# Patient Record
Sex: Female | Born: 1985 | Hispanic: Yes | Marital: Single | Smoking: Current every day smoker
Health system: Southern US, Community
[De-identification: ages and names within clinical notes are randomized; demographics above are authoritative.]

## PROBLEM LIST (undated history)

## (undated) DIAGNOSIS — F17210 Nicotine dependence, cigarettes, uncomplicated: Secondary | ICD-10-CM

## (undated) HISTORY — DX: Nicotine dependence, cigarettes, uncomplicated: F17.210

---

## 2020-09-17 ENCOUNTER — Other Ambulatory Visit: Payer: Self-pay | Admitting: Student in an Organized Health Care Education/Training Program

## 2020-09-17 DIAGNOSIS — R590 Localized enlarged lymph nodes: Secondary | ICD-10-CM

## 2020-09-17 DIAGNOSIS — R519 Headache, unspecified: Secondary | ICD-10-CM

## 2020-09-23 ENCOUNTER — Other Ambulatory Visit: Payer: Self-pay

## 2020-09-24 ENCOUNTER — Other Ambulatory Visit: Payer: Self-pay

## 2020-09-24 ENCOUNTER — Other Ambulatory Visit: Payer: Self-pay | Admitting: Student in an Organized Health Care Education/Training Program

## 2020-09-24 DIAGNOSIS — R519 Headache, unspecified: Secondary | ICD-10-CM

## 2020-09-24 DIAGNOSIS — R590 Localized enlarged lymph nodes: Secondary | ICD-10-CM

## 2020-09-28 ENCOUNTER — Other Ambulatory Visit: Payer: Medicaid Other

## 2020-09-28 ENCOUNTER — Other Ambulatory Visit: Payer: Self-pay | Admitting: Student in an Organized Health Care Education/Training Program

## 2020-09-28 DIAGNOSIS — R590 Localized enlarged lymph nodes: Secondary | ICD-10-CM

## 2020-09-30 ENCOUNTER — Other Ambulatory Visit: Payer: Self-pay | Admitting: Student in an Organized Health Care Education/Training Program

## 2020-09-30 DIAGNOSIS — R519 Headache, unspecified: Secondary | ICD-10-CM

## 2020-09-30 DIAGNOSIS — R591 Generalized enlarged lymph nodes: Secondary | ICD-10-CM

## 2020-10-01 ENCOUNTER — Other Ambulatory Visit: Payer: Medicaid Other

## 2020-10-02 ENCOUNTER — Other Ambulatory Visit: Payer: Medicaid Other

## 2020-10-08 ENCOUNTER — Ambulatory Visit
Admission: RE | Admit: 2020-10-08 | Discharge: 2020-10-08 | Disposition: A | Discharge status: Home / Self Care | Payer: Medicaid Other | Source: Ambulatory Visit | Attending: Student in an Organized Health Care Education/Training Program | Admitting: Student in an Organized Health Care Education/Training Program

## 2020-10-08 DIAGNOSIS — R591 Generalized enlarged lymph nodes: Secondary | ICD-10-CM

## 2020-10-11 ENCOUNTER — Telehealth: Payer: Self-pay | Admitting: Hematology

## 2020-10-11 NOTE — Telephone Encounter (Signed)
Received anew pt referral from [facility] for enlarged lymph nodes. Amber Montgomery has been cld and scheduled to see Dr. Irene Limbo on 8/10 at 1pm per pt's request. I offered her an appt later this week but pt declined.

## 2020-10-20 ENCOUNTER — Inpatient Hospital Stay: Payer: Medicaid Other | Admitting: Hematology

## 2020-10-22 ENCOUNTER — Other Ambulatory Visit: Payer: Self-pay | Admitting: Student in an Organized Health Care Education/Training Program

## 2020-10-22 ENCOUNTER — Other Ambulatory Visit: Payer: Self-pay

## 2020-10-22 ENCOUNTER — Encounter: Payer: Self-pay | Admitting: Hematology and Oncology

## 2020-10-22 ENCOUNTER — Inpatient Hospital Stay: Payer: Medicaid Other

## 2020-10-22 ENCOUNTER — Inpatient Hospital Stay: Payer: Medicaid Other | Attending: Hematology | Admitting: Hematology and Oncology

## 2020-10-22 VITALS — BP 108/69 | HR 63 | Temp 98.1°F | Resp 17 | Ht 62.0 in | Wt 211.3 lb

## 2020-10-22 DIAGNOSIS — R599 Enlarged lymph nodes, unspecified: Secondary | ICD-10-CM | POA: Diagnosis not present

## 2020-10-22 DIAGNOSIS — R591 Generalized enlarged lymph nodes: Secondary | ICD-10-CM | POA: Diagnosis not present

## 2020-10-22 DIAGNOSIS — C799 Secondary malignant neoplasm of unspecified site: Secondary | ICD-10-CM

## 2020-10-22 DIAGNOSIS — R519 Headache, unspecified: Secondary | ICD-10-CM | POA: Insufficient documentation

## 2020-10-22 DIAGNOSIS — R590 Localized enlarged lymph nodes: Secondary | ICD-10-CM

## 2020-10-22 LAB — CBC WITH DIFFERENTIAL (CANCER CENTER ONLY)
Abs Immature Granulocytes: 0.02 10*3/uL (ref 0.00–0.07)
Basophils Absolute: 0.1 10*3/uL (ref 0.0–0.1)
Basophils Relative: 1 %
Eosinophils Absolute: 0.3 10*3/uL (ref 0.0–0.5)
Eosinophils Relative: 5 %
HCT: 40.2 % (ref 36.0–46.0)
Hemoglobin: 13.8 g/dL (ref 12.0–15.0)
Immature Granulocytes: 0 %
Lymphocytes Relative: 29 %
Lymphs Abs: 2 10*3/uL (ref 0.7–4.0)
MCH: 32.3 pg (ref 26.0–34.0)
MCHC: 34.3 g/dL (ref 30.0–36.0)
MCV: 94.1 fL (ref 80.0–100.0)
Monocytes Absolute: 0.5 10*3/uL (ref 0.1–1.0)
Monocytes Relative: 7 %
Neutro Abs: 4 10*3/uL (ref 1.7–7.7)
Neutrophils Relative %: 58 %
Platelet Count: 337 10*3/uL (ref 150–400)
RBC: 4.27 MIL/uL (ref 3.87–5.11)
RDW: 12 % (ref 11.5–15.5)
WBC Count: 6.8 10*3/uL (ref 4.0–10.5)
nRBC: 0 % (ref 0.0–0.2)

## 2020-10-22 LAB — LACTATE DEHYDROGENASE: LDH: 170 U/L (ref 98–192)

## 2020-10-22 LAB — CMP (CANCER CENTER ONLY)
ALT: 16 U/L (ref 0–44)
AST: 11 U/L — ABNORMAL LOW (ref 15–41)
Albumin: 3.8 g/dL (ref 3.5–5.0)
Alkaline Phosphatase: 42 U/L (ref 38–126)
Anion gap: 7 (ref 5–15)
BUN: 12 mg/dL (ref 6–20)
CO2: 24 mmol/L (ref 22–32)
Calcium: 8.5 mg/dL — ABNORMAL LOW (ref 8.9–10.3)
Chloride: 109 mmol/L (ref 98–111)
Creatinine: 0.68 mg/dL (ref 0.44–1.00)
GFR, Estimated: >60 mL/min (ref >60)
Glucose, Bld: 96 mg/dL (ref 70–99)
Potassium: 4.5 mmol/L (ref 3.5–5.1)
Sodium: 140 mmol/L (ref 135–145)
Total Bilirubin: 0.3 mg/dL (ref 0.3–1.2)
Total Protein: 6.8 g/dL (ref 6.5–8.1)

## 2020-10-22 LAB — SEDIMENTATION RATE: Sed Rate: 10 mm/hr (ref 0–22)

## 2020-10-22 LAB — C-REACTIVE PROTEIN: CRP: <0.5 mg/dL (ref <1.0)

## 2020-10-22 LAB — SAVE SMEAR(SSMR), FOR PROVIDER SLIDE REVIEW

## 2020-10-22 NOTE — Progress Notes (Signed)
[facility] [phone]   [phone]  INITIAL CONSULT NOTE  Patient Care Team: Pcp, No as PCP - General  Hematological/Oncological History # Localized Enlarged Soft Tissue Foci 10/08/2020: US soft tissue neck showed multiple lobular soft tissue foci within the right supraclavicular/retroclavicular region of concern, measuring up to 4.8 x 1.4 x 6.0 cm. 10/22/2020: establish care with Dr. Lorenso Courier  CHIEF COMPLAINTS/PURPOSE OF CONSULTATION:  "Localized Enlarged Lymph Nodes "  HISTORY OF PRESENTING ILLNESS:  Amber Montgomery 35 y.o. female with medical history significant for low TSH, irregular menstrual cycle, and acute intractable headache who presents for evaluation of increased right supraclavicular lymphadenopathy.   On review of the previous records Amber Montgomery was found to have a soft tissue foci in the right supraclavicular retroclavicular region by her primary care provider.  It been present for several months.  An ultrasound of this lesion was ordered and on 10/08/2020 which showed a lesion measuring 4.8 x 1.4 x 6.0 cm.  Subsequent CT scan of the neck and chest were ordered.  Due to issues of severe headaches a CT scan and MRI of the brain were also ordered.  The patient was referred to oncology for further evaluation management.  On exam today Amber Montgomery reports that she is been having episodes of really terrible headaches.  She notes that they are "thunderclap headaches".  She notes that she has been vomiting and presented to her PCP with these concerns.  She recently moved from [place] to [place].  She notes that the lump in her neck has been growing over several months.  She notes when she first found it is about the size of a grape and now it is approximately the size of a small lemon.  She notes it is nontender and she does not have any other lymphadenopathy.  She has been having heavy sweats including sweats that caused her to change her  clothes or change the sheets at night.  She is not been having any fevers or chills and denies any diarrhea.  On further discussion she reports that she smokes 1/2 pack/day.  She does not drink any alcohol.  She currently works as an alcohol and Land.  Her family history is noncontributory.  A full 10 point ROS is listed below.  MEDICAL HISTORY:  Past Medical History:  Diagnosis Date   Smoking 1/2 pack a day or less     SURGICAL HISTORY: Past Surgical History:  Procedure Laterality Date   CESAREAN SECTION     Three c-sections    SOCIAL HISTORY: Social History   Socioeconomic History   Marital status: Single    Spouse name: Not on file   Number of children: 3   Years of education: Not on file   Highest education level: Not on file  Occupational History   Not on file  Tobacco Use   Smoking status: Every Day    Packs/day: 0.50    Years: 18.00    Pack years: 9.00    Types: Cigarettes   Smokeless tobacco: Not on file  Substance and Sexual Activity   Alcohol use: Not Currently   Drug use: Yes    Types: Marijuana   Sexual activity: Yes    Birth control/protection: Surgical  Other Topics Concern   Not on file  Social History Narrative   Not on file   Social Determinants of Health   Financial Resource Strain: Not on file  Food Insecurity: Not on file  Transportation  Needs: Not on file  Physical Activity: Not on file  Stress: Not on file  Social Connections: Not on file  Intimate Partner Violence: Not on file    FAMILY HISTORY: History reviewed. No pertinent family history.  ALLERGIES:  has No Known Allergies.  MEDICATIONS:  No current outpatient medications on file.   No current facility-administered medications for this visit.    REVIEW OF SYSTEMS:   Constitutional: ( - ) fevers, ( - )  chills , ( - ) night sweats Eyes: ( - ) blurriness of vision, ( - ) double vision, ( - ) watery eyes Ears, nose, mouth, throat, and face: ( - ) mucositis, ( - )  sore throat Respiratory: ( - ) cough, ( - ) dyspnea, ( - ) wheezes Cardiovascular: ( - ) palpitation, ( - ) chest discomfort, ( - ) lower extremity swelling Gastrointestinal:  ( - ) nausea, ( - ) heartburn, ( - ) change in bowel habits Skin: ( - ) abnormal skin rashes Lymphatics: ( - ) new lymphadenopathy, ( - ) easy bruising Neurological: ( - ) numbness, ( - ) tingling, ( - ) new weaknesses Behavioral/Psych: ( - ) mood change, ( - ) new changes  All other systems were reviewed with the patient and are negative.  PHYSICAL EXAMINATION: ECOG PERFORMANCE STATUS: 0 - Asymptomatic  Vitals:   10/22/20 0842  BP: 108/69  Pulse: 63  Resp: 17  Temp: 98.1 F (36.7 C)  SpO2: 98%   Filed Weights   10/22/20 0842  Weight: 211 lb 4.8 oz (95.8 kg)    GENERAL: well appearing young female in NAD  SKIN: skin color, texture, turgor are normal, no rashes or significant lesions EYES: conjunctiva are pink and non-injected, sclera clear NECK: supple, non-tender LYMPH: Soft tissue mass above the right clavicle.  Feels almost cystic in nature.  Not firm.  Otherwise no palpable lymphadenopathy in the cervical, axillary or supraclavicular lymph nodes.  LUNGS: clear to auscultation and percussion with normal breathing effort HEART: regular rate & rhythm and no murmurs and no lower extremity edema Musculoskeletal: no cyanosis of digits and no clubbing  PSYCH: alert & oriented x 3, fluent speech NEURO: no focal motor/sensory deficits  LABORATORY DATA:  I have reviewed the data as listed CBC Latest Ref Rng & Units 10/22/2020  WBC 4.0 - 10.5 K/uL 6.8  Hemoglobin 12.0 - 15.0 g/dL 13.8  Hematocrit 36.0 - 46.0 % 40.2  Platelets 150 - 400 K/uL 337    CMP Latest Ref Rng & Units 10/22/2020  Glucose 70 - 99 mg/dL 96  BUN 6 - 20 mg/dL 12  Creatinine 0.44 - 1.00 mg/dL 0.68  Sodium 135 - 145 mmol/L 140  Potassium 3.5 - 5.1 mmol/L 4.5  Chloride 98 - 111 mmol/L 109  CO2 22 - 32 mmol/L 24  Calcium 8.9 - 10.3  mg/dL 8.5(L)  Total Protein 6.5 - 8.1 g/dL 6.8  Total Bilirubin 0.3 - 1.2 mg/dL 0.3  Alkaline Phos 38 - 126 U/L 42  AST 15 - 41 U/L 11(L)  ALT 0 - 44 U/L 16     RADIOGRAPHIC STUDIES: US SOFT TISSUE HEAD & NECK (NON-THYROID)  Result Date: 10/08/2020 CLINICAL DATA:  Lymphadenopathy R59.1 (ICD-10-CM). Infraclavicular lymphadenopathy - right infraclavicular lymph node mass 2 inch by 1.5 inch nontender, movable, smooth borders, no skin color changes, enlarged over 1 month per pt but present for 1 yr smaller in size per pt cervical lymphadenopathy bilaterally on PE. Additional history provided  by scanning technologist: Patient reports right supraclavicular lymphadenopathy for months. EXAM: ULTRASOUND OF HEAD/NECK SOFT TISSUES TECHNIQUE: Ultrasound examination of the head and neck soft tissues was performed in the area of clinical concern. COMPARISON:  No pertinent prior exams available for comparison. FINDINGS: Within the right supraclavicular/retroclavicular region of concern, there are multiple lobular soft tissue foci which demonstrate homogeneous hyperechogenicity as compared to background fat. These foci measure up to 4.8 x 1.4 x 6.0 cm. These results will be called to the ordering clinician or representative by the Radiologist Assistant, and communication documented in the PACS or [employer]. IMPRESSION: Multiple lobular soft tissue foci within the right supraclavicular/retroclavicular region of concern, measuring up to 4.8 x 1.4 x 6.0 cm. Findings are most suggestive of conglomerate lymphadenopathy, and this is highly suspicious for nodal metastatic disease or a lymphoproliferative process (such as lymphoma). Multiple small masses may have a similar imaging appearance, and are not excluded. Further evaluation with contrast-enhanced CT of the neck and chest recommended for further evaluation. Electronically Signed   By: Jackey Loge DO   On: 10/08/2020 12:44    ASSESSMENT & PLAN Amber Montgomery  35 y.o. female with medical history significant for low TSH, irregular menstrual cycle, and acute intractable headache who presents for evaluation of increased right supraclavicular lymphadenopathy.   After review of the labs, review of the records, and discussion with the patient the patients findings are most consistent with a soft tissue mass of unclear etiology.  The mass is remarkably soft and feels almost fluid-filled in nature.  It does not have the usual firmness or texture of a supraclavicular lymph node.  I am unconvinced at this time that this truly represents a lymph node.  As such I would recommend that she obtain the previously ordered CT scan of the neck and chest.  This would help [place] to better identify this lesion.  I would also make a referral to ENT today for consideration of excisional biopsy, drainage, or further evaluation.  I would imagine if this lesion were fluid-filled that would be detected on ultrasound but at this time it seems to be of solid soft tissue nature.  Overall low suspicion for malignancy though today we will order CBC, CMP, LDH, flow cytometry and inflammatory markers.  We will put a placeholder appointment in about 6 weeks time in order to further evaluate.  # Localized Enlarged Soft Tissue Foci -- Findings are concerning for large collection of right-sided soft tissue lesions.  Lymphoma or metastatic malignancy are certainly on the differential, but on palpation the lesion was considerably softer than lymphadenopathy.  --Agree with obtaining a CT soft tissue neck with contrast as previously ordered --will make referral to ENT for consideration of excisional biopsy or drainage. Would prefer not to have an FNA if findings are consistent with lymphadenopathy.  --Today we will pursue CBC, CMP, LDH, and flow cytometry.  Additionally we will order inflammatory work-up with ESR and CRP --We will hold on further imaging of the abdomen pelvis until a possible diagnosis is  confirmed. --Placeholder appointment to be scheduled in approximately 6 weeks time.  #Intractable Headache --unclear if this is related to the process causing lymphadenopathy --imaging and further workup per PCP. They have ordered MRI brain and CT head  Orders Placed This Encounter  Procedures   CBC with Differential (Cancer Center Only)    Standing Status:   Future    Number of Occurrences:   1    Standing Expiration Date:  10/22/2021   CMP ([facility] only)    Standing Status:   Future    Number of Occurrences:   1    Standing Expiration Date:   10/22/2021   Lactate dehydrogenase (LDH)    Standing Status:   Future    Number of Occurrences:   1    Standing Expiration Date:   10/22/2021   Sedimentation rate    Standing Status:   Future    Number of Occurrences:   1    Standing Expiration Date:   10/22/2021   C-reactive protein    Standing Status:   Future    Number of Occurrences:   1    Standing Expiration Date:   10/22/2021   Save Smear (SSMR)    Standing Status:   Future    Number of Occurrences:   1    Standing Expiration Date:   10/22/2021   Flow Cytometry, Peripheral Blood (Oncology)    Standing Status:   Future    Number of Occurrences:   1    Standing Expiration Date:   10/22/2021    All questions were answered. The patient knows to call the clinic with any problems, questions or concerns.  A total of more than 60 minutes were spent on this encounter with face-to-face time and non-face-to-face time, including preparing to see the patient, ordering tests and/or medications, counseling the patient and coordination of care as outlined above.   Ledell Peoples, MD Department of Hematology/Oncology [place] at [facility] Phone: [phone] Pager: [phone] Email: [email or web address]  10/22/2020 10:29 AM

## 2020-10-25 LAB — SURGICAL PATHOLOGY

## 2020-10-26 ENCOUNTER — Other Ambulatory Visit: Payer: Self-pay | Admitting: Physician Assistant

## 2020-10-26 DIAGNOSIS — R599 Enlarged lymph nodes, unspecified: Secondary | ICD-10-CM

## 2020-10-26 LAB — FLOW CYTOMETRY

## 2020-11-05 ENCOUNTER — Other Ambulatory Visit: Payer: Self-pay

## 2020-11-05 ENCOUNTER — Ambulatory Visit (HOSPITAL_COMMUNITY)
Admission: RE | Admit: 2020-11-05 | Discharge: 2020-11-05 | Disposition: A | Discharge status: Home / Self Care | Payer: Medicaid Other | Source: Ambulatory Visit | Attending: Physician Assistant | Admitting: Physician Assistant

## 2020-11-05 DIAGNOSIS — R599 Enlarged lymph nodes, unspecified: Secondary | ICD-10-CM | POA: Insufficient documentation

## 2020-11-05 MED ORDER — IOHEXOL 350 MG/ML SOLN
65.0000 mL | Freq: Once | INTRAVENOUS | Status: AC | PRN
  Administered 2020-11-05: 65 mL via INTRAVENOUS

## 2020-11-11 ENCOUNTER — Telehealth: Payer: Self-pay | Admitting: *Deleted

## 2020-11-11 NOTE — Telephone Encounter (Signed)
-----   Message from Orson Slick, MD sent at 11/11/2020  9:43 AM EDT ----- Please let Mrs. 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 There is no need for surgical intervention or biopsy. We can cancel our upcoming visit if she would like.   ----- Message ----- From: Cordelia Poche Sent: 11/05/2020  10:39 AM EDT To: Otila Kluver, RN, Orson Slick, MD   ----- Message ----- From: Interface, Rad Results In Sent: 11/05/2020  10:26 AM EDT To: Lincoln Brigham, PA-C

## 2020-11-11 NOTE — Telephone Encounter (Signed)
TCT patient regarding results of recent scan. 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 Pt voiced understanding and is agreeable to cancelling future appt.

## 2020-12-03 ENCOUNTER — Ambulatory Visit: Payer: Medicaid Other | Admitting: Hematology and Oncology

## 2020-12-03 ENCOUNTER — Other Ambulatory Visit: Payer: Medicaid Other

## 2021-05-10 ENCOUNTER — Ambulatory Visit (HOSPITAL_COMMUNITY)
Admission: EM | Admit: 2021-05-10 | Discharge: 2021-05-10 | Disposition: A | Discharge status: Home / Self Care | Payer: Medicaid Other | Attending: Family Medicine | Admitting: Family Medicine

## 2021-05-10 ENCOUNTER — Other Ambulatory Visit: Payer: Self-pay

## 2021-05-10 ENCOUNTER — Encounter (HOSPITAL_COMMUNITY): Payer: Self-pay

## 2021-05-10 DIAGNOSIS — J069 Acute upper respiratory infection, unspecified: Secondary | ICD-10-CM | POA: Insufficient documentation

## 2021-05-10 DIAGNOSIS — J45901 Unspecified asthma with (acute) exacerbation: Secondary | ICD-10-CM | POA: Insufficient documentation

## 2021-05-10 DIAGNOSIS — Z20822 Contact with and (suspected) exposure to covid-19: Secondary | ICD-10-CM | POA: Insufficient documentation

## 2021-05-10 DIAGNOSIS — R079 Chest pain, unspecified: Secondary | ICD-10-CM | POA: Insufficient documentation

## 2021-05-10 MED ORDER — ALBUTEROL SULFATE HFA 108 (90 BASE) MCG/ACT IN AERS: 1.0000 | INHALATION_SPRAY | RESPIRATORY_TRACT | 0 refills | Status: AC | PRN

## 2021-05-10 MED ORDER — PREDNISONE 20 MG PO TABS: 40.0000 mg | ORAL_TABLET | Freq: Every day | ORAL | 0 refills | Status: AC

## 2021-05-10 NOTE — Discharge Instructions (Addendum)
°  You have been swabbed for COVID, and the test will result in the next 24 hours. Our staff will call you if positive. If the test is positive, you should quarantine for 5 days.   Use the albuterol inhaler 2 puffs every 4 hours as needed for wheezing or shortness of breath  Take prednisone 20 mg 2 daily for 5 days.

## 2021-05-10 NOTE — ED Triage Notes (Signed)
Pt reports having chest pain (medial, non radiating) congestion and cough that began this morning.

## 2021-05-10 NOTE — ED Provider Notes (Signed)
[facility]    CSN: [id number] Arrival date & time: 05/10/21  1251      History   Chief Complaint Chief Complaint  Patient presents with   Chest Pain   Cough   Nasal Congestion    HPI Amber Montgomery is a 36 y.o. female.    Chest Pain Associated symptoms: cough   Cough Associated symptoms: chest pain   Here with a 1 day history of cough, nasal congestion, and chest pain.  She has also had some shortness of breath.  No fever noted but she has had some chills.  No vomiting or diarrhea.  Medical history significant for asthma.  She does not have an inhaler at home  Past Medical History:  Diagnosis Date   Smoking 1/2 pack a day or less     There are no problems to display for this patient.   Past Surgical History:  Procedure Laterality Date   CESAREAN SECTION     Three c-sections    OB History   No obstetric history on file.      Home Medications    Prior to Admission medications   Medication Sig Start Date End Date Taking? Authorizing Provider  albuterol (VENTOLIN HFA) 108 (90 Base) MCG/ACT inhaler Inhale 1-2 puffs into the lungs every 4 (four) hours as needed for wheezing or shortness of breath. 05/10/21  Yes Amberlynn Tempesta, Gwenlyn Perking, MD  predniSONE (DELTASONE) 20 MG tablet Take 2 tablets (40 mg total) by mouth daily with breakfast for 5 days. 05/10/21 05/15/21 Yes Barrett Henle, MD    Family History History reviewed. No pertinent family history.  Social History Social History   Tobacco Use   Smoking status: Every Day    Packs/day: 0.50    Years: 18.00    Pack years: 9.00    Types: Cigarettes  Substance Use Topics   Alcohol use: Not Currently   Drug use: Not Currently    Types: Marijuana     Allergies   Patient has no known allergies.   Review of Systems Review of Systems  Respiratory:  Positive for cough.   Cardiovascular:  Positive for chest pain.    Physical Exam Triage Vital Signs ED Triage Vitals  Enc Vitals Group      BP 05/10/21 1403 118/77     Pulse Rate 05/10/21 1403 80     Resp 05/10/21 1403 20     Temp 05/10/21 1403 98.2 F (36.8 C)     Temp Source 05/10/21 1403 Oral     SpO2 05/10/21 1403 96 %     Weight --      Height --      Head Circumference --      Peak Flow --      Pain Score 05/10/21 1401 10     Pain Loc --      Pain Edu? --      Excl. in [place]? --    No data found.  Updated Vital Signs BP 118/77 (BP Location: Left Arm)    Pulse 80    Temp 98.2 F (36.8 C) (Oral)    Resp 20    LMP 04/30/2021    SpO2 96%   Visual Acuity Right Eye Distance:   Left Eye Distance:   Bilateral Distance:    Right Eye Near:   Left Eye Near:    Bilateral Near:     Physical Exam Vitals reviewed.  Constitutional:      General: She is not in  acute distress.    Appearance: She is not toxic-appearing.  HENT:     Right Ear: Tympanic membrane and ear canal normal.     Left Ear: Tympanic membrane and ear canal normal.     Nose: Congestion present.     Mouth/Throat:     Mouth: Mucous membranes are moist.     Pharynx: Oropharyngeal exudate (clear mucus draining) present.  Eyes:     Extraocular Movements: Extraocular movements intact.     Pupils: Pupils are equal, round, and reactive to light.  Cardiovascular:     Rate and Rhythm: Normal rate and regular rhythm.     Heart sounds: No murmur heard. Pulmonary:     Effort: Pulmonary effort is normal.     Breath sounds: Normal breath sounds.  Musculoskeletal:     Cervical back: Neck supple.     Right lower leg: No edema.     Left lower leg: No edema.  Lymphadenopathy:     Cervical: No cervical adenopathy.  Skin:    Capillary Refill: Capillary refill takes less than 2 seconds.     Coloration: Skin is not jaundiced or pale.  Neurological:     General: No focal deficit present.     Mental Status: She is oriented to person, place, and time.  Psychiatric:        Behavior: Behavior normal.     UC Treatments / Results  Labs (all labs ordered are  listed, but only abnormal results are displayed) Labs Reviewed - No data to display  EKG   Radiology No results found.  Procedures Procedures (including critical care time)  Medications Ordered in UC Medications - No data to display  Initial Impression / Assessment and Plan / UC Course  I have reviewed the triage vital signs and the nursing notes.  Pertinent labs & imaging results that were available during my care of the patient were reviewed by me and considered in my medical decision making (see chart for details).     We will treat for asthma exacerbation and swab for COVID.  Her elevated BMI and her asthma, she is at risk for severe COVID.  If she is positive she should have prescription for Paxlovid sent in.  Renal function is normal on labs done last year Final Clinical Impressions(s) / UC Diagnoses   Final diagnoses:  Viral upper respiratory tract infection     Discharge Instructions       You have been swabbed for COVID, and the test will result in the next 24 hours. Our staff will call you if positive. If the test is positive, you should quarantine for 5 days.   Use the albuterol inhaler 2 puffs every 4 hours as needed for wheezing or shortness of breath  Take prednisone 20 mg 2 daily for 5 days.     ED Prescriptions     Medication Sig Dispense Auth. Provider   predniSONE (DELTASONE) 20 MG tablet Take 2 tablets (40 mg total) by mouth daily with breakfast for 5 days. 10 tablet Darnetta Kesselman, Gwenlyn Perking, MD   albuterol (VENTOLIN HFA) 108 (90 Base) MCG/ACT inhaler Inhale 1-2 puffs into the lungs every 4 (four) hours as needed for wheezing or shortness of breath. 1 each Barrett Henle, MD      PDMP not reviewed this encounter.   Barrett Henle, MD 05/10/21 [phone]

## 2021-05-11 LAB — SARS CORONAVIRUS 2 (TAT 6-24 HRS): SARS Coronavirus 2: NEGATIVE

## 2022-09-21 IMAGING — US US SOFT TISSUE HEAD/NECK
1 series · 13 of 15 positions shown · non-contrast
Comparison: No pertinent prior exams available for comparison.

CLINICAL DATA: Lymphadenopathy 5YM.5 (GIB-P5-CM). Infraclavicular
lymphadenopathy - right infraclavicular lymph node mass 2 inch by
1.5 inch nontender, movable, smooth borders, no skin color changes,
enlarged over 1 month per pt but present for 1 yr smaller in size
per pt cervical lymphadenopathy bilaterally on PE. Additional
history provided by scanning technologist: Patient reports right
supraclavicular lymphadenopathy for months.

EXAM:
ULTRASOUND OF HEAD/NECK SOFT TISSUES
TECHNIQUE: Ultrasound examination of the head and neck soft tissues was
performed in the area of clinical concern.

[Series 1: us soft tissue head/neck · 0.07mm/px · 15 acquisitions, 13 frames shown]
[im 1/15]
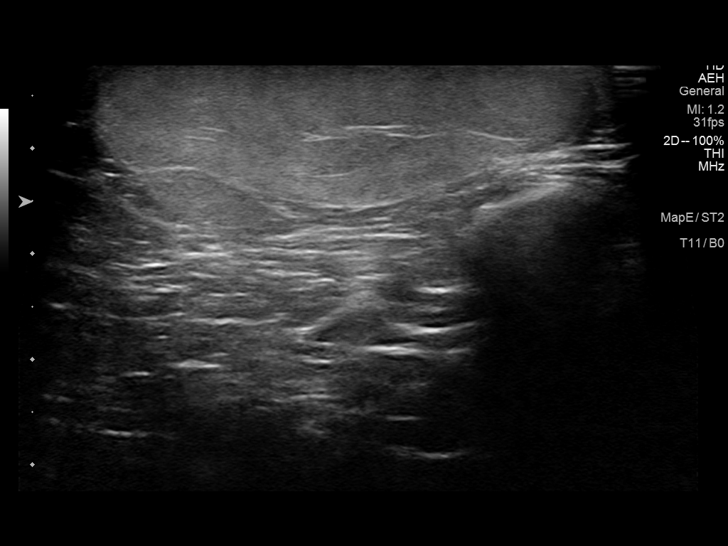
[im 2/15]
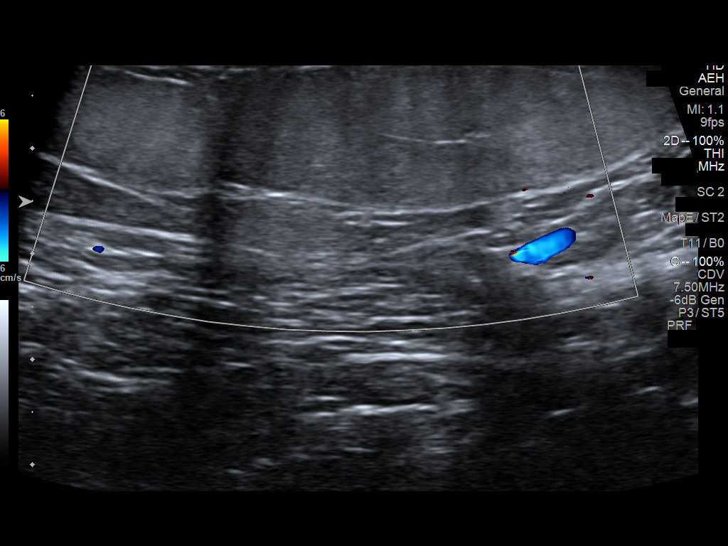
[im 3/15]
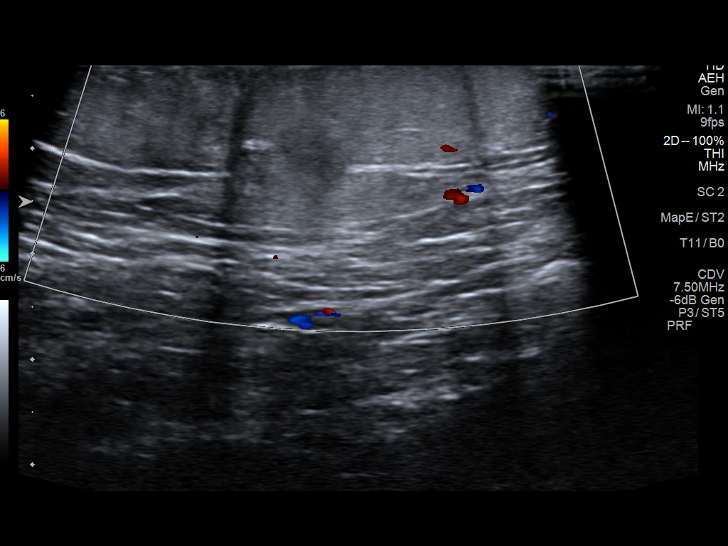
[im 5/15]
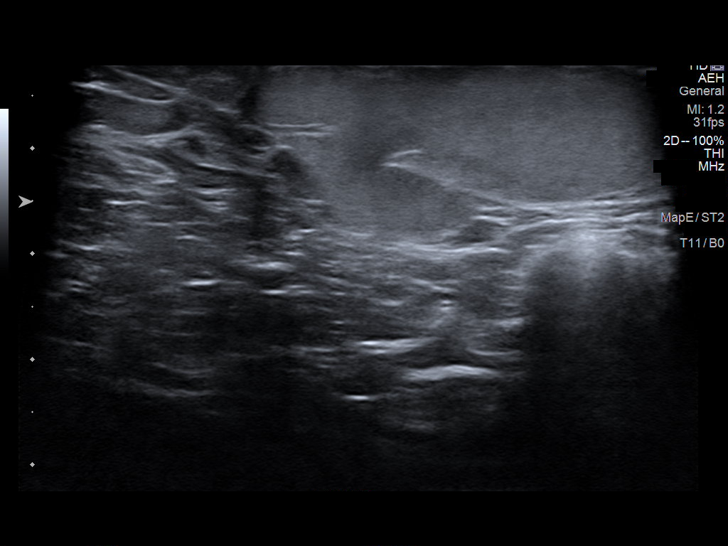
[im 6/15]
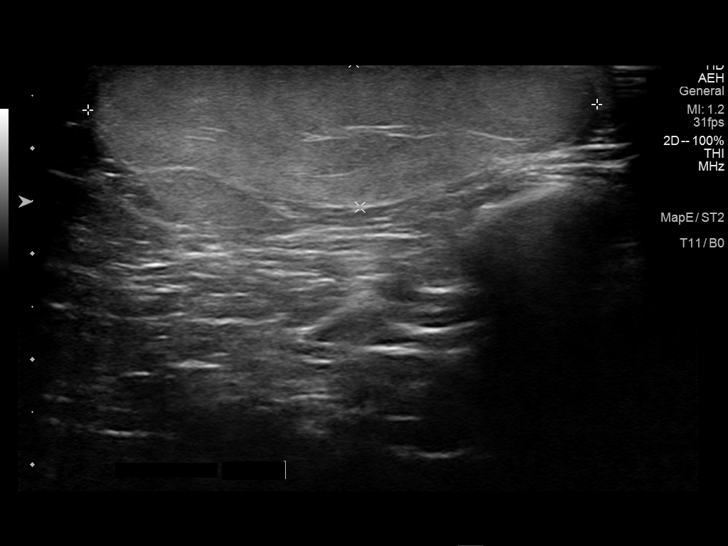
[im 7/15]
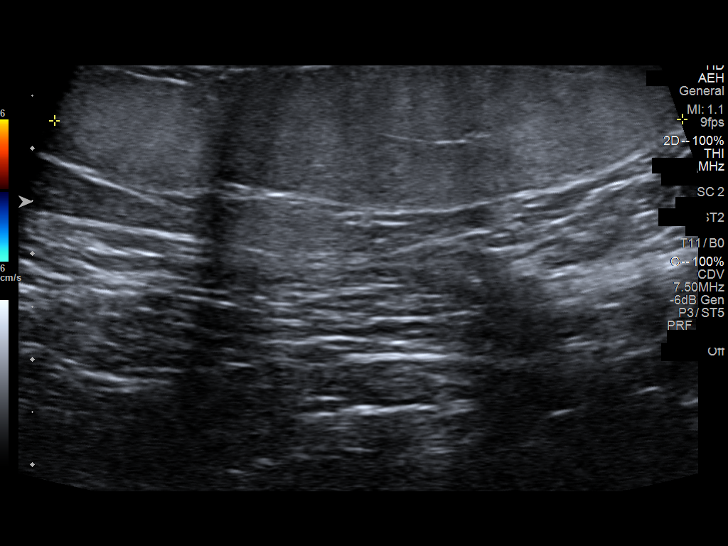
[im 8/15]
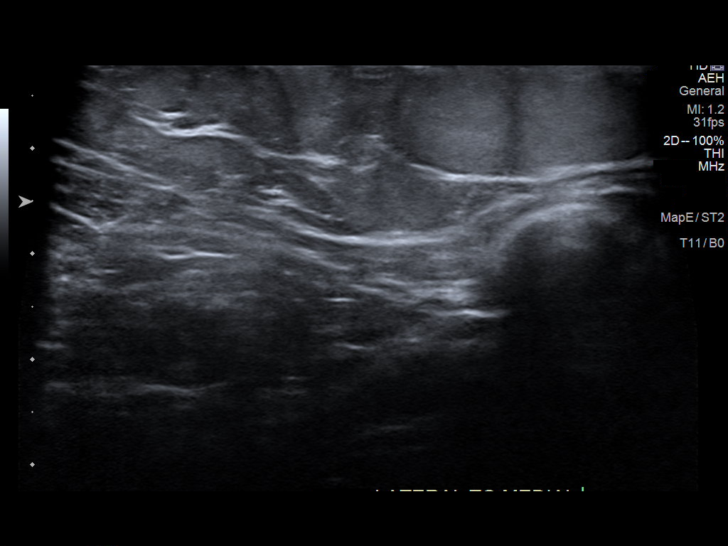
[im 9/15]
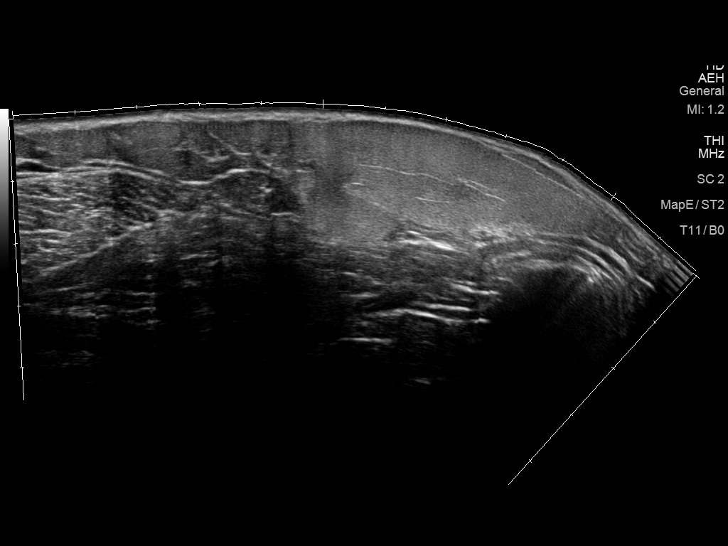
[im 10/15]
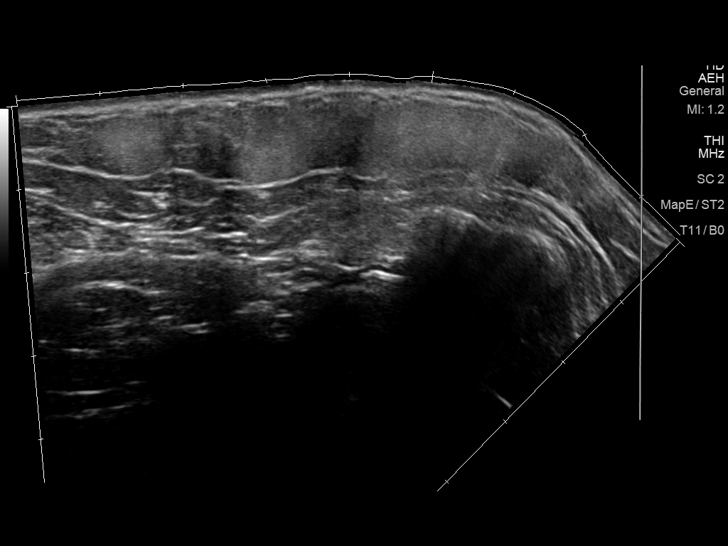
[im 11/15]
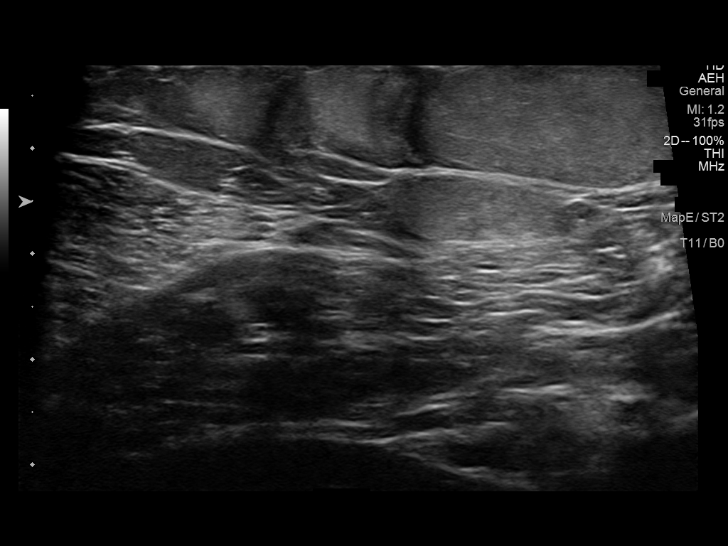
[im 13/15]
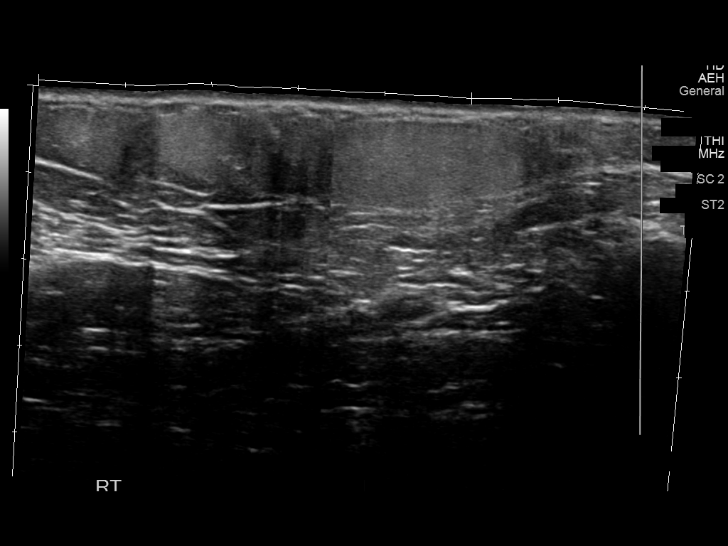
[im 14/15]
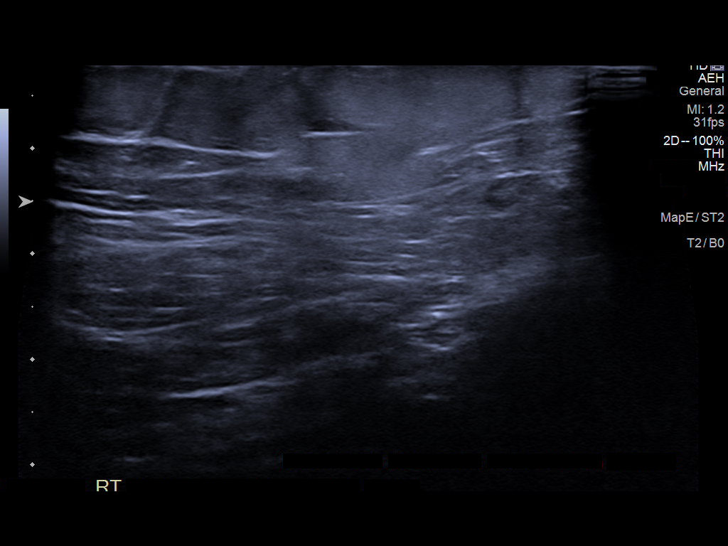
[im 15/15]
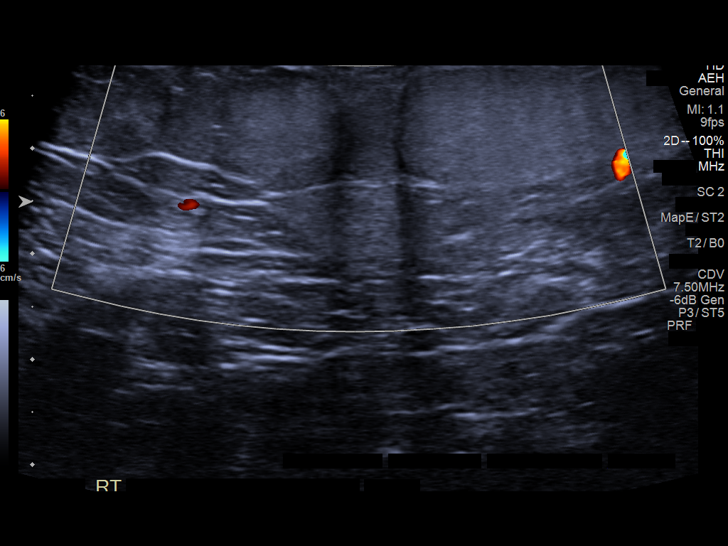

[13 of 15 positions shown; findings below may reference images not displayed]

FINDINGS: Within the right supraclavicular/retroclavicular region of concern,
there are multiple lobular soft tissue foci which demonstrate
homogeneous hyperechogenicity as compared to background fat. These
foci measure up to 4.8 x 1.4 x 6.0 cm.

These results will be called to the ordering clinician or
representative by the Radiologist Assistant, and communication
documented in the PACS or [REDACTED].
IMPRESSION: Multiple lobular soft tissue foci within the right
supraclavicular/retroclavicular region of concern, measuring up to
4.8 x 1.4 x 6.0 cm. Findings are most suggestive of conglomerate
lymphadenopathy, and this is highly suspicious for nodal metastatic
disease or a lymphoproliferative process (such as lymphoma).
Multiple small masses may have a similar imaging appearance, and are
not excluded. Further evaluation with contrast-enhanced CT of the
neck and chest recommended for further evaluation.

## 2022-10-19 IMAGING — CT CT NECK W/ CM
4 of 6 series · 12 of 33 positions shown, 14 images · IV contrast (OMNIPAQUE)
Comparison: Soft tissue ultrasound right neck and shoulder
10/08/2020

CLINICAL DATA: Cervical lymphadenopathy. Swelling right neck and
shoulder marked with BB.

EXAM:
CT NECK WITH CONTRAST
TECHNIQUE: Multidetector CT imaging of the neck was performed using the
standard protocol following the bolus administration of intravenous
contrast.
CONTRAST:  65mL OMNIPAQUE IOHEXOL 350 MG/ML SOLN

[Series 4: axial neck · axial · 0.42mm/px · z∈[+1536,+1612]mm · 2 of 115 slices shown, 3 images]
[im 39/115  soft-tissue]
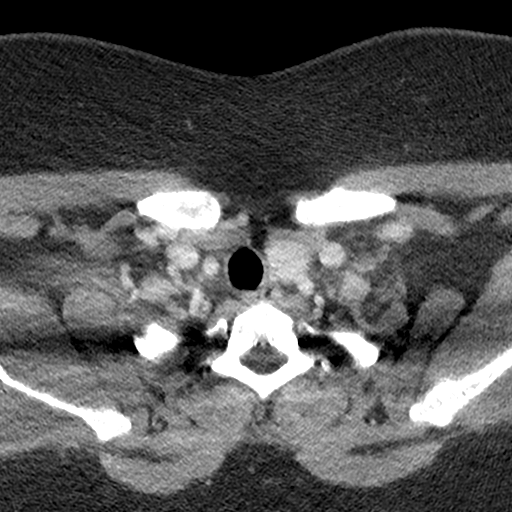
[im 39/115  bone]
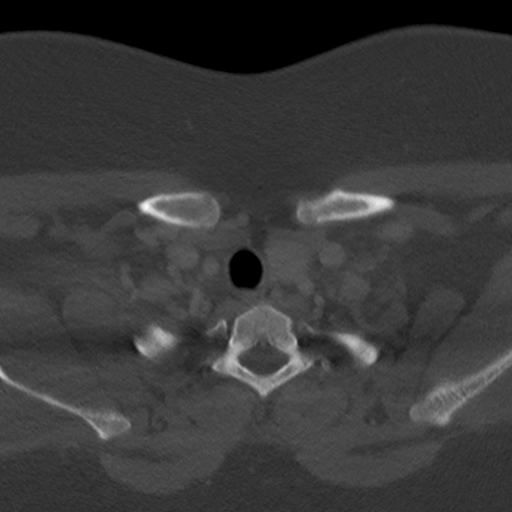
[im 77/115  bone]
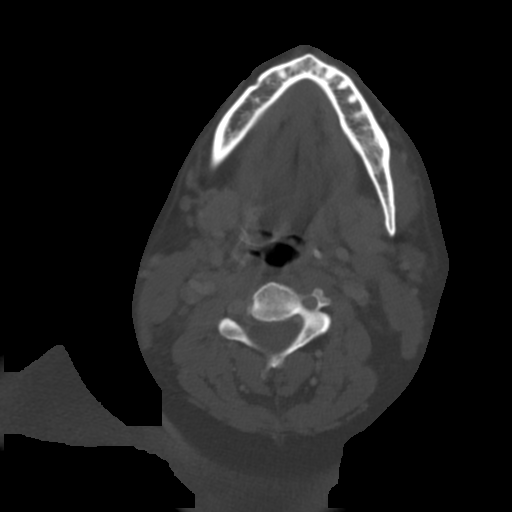

[Series 7: cor neck · coronal · 0.33mm/px · 3 of 101 slices shown]
[im 21/101  bone]
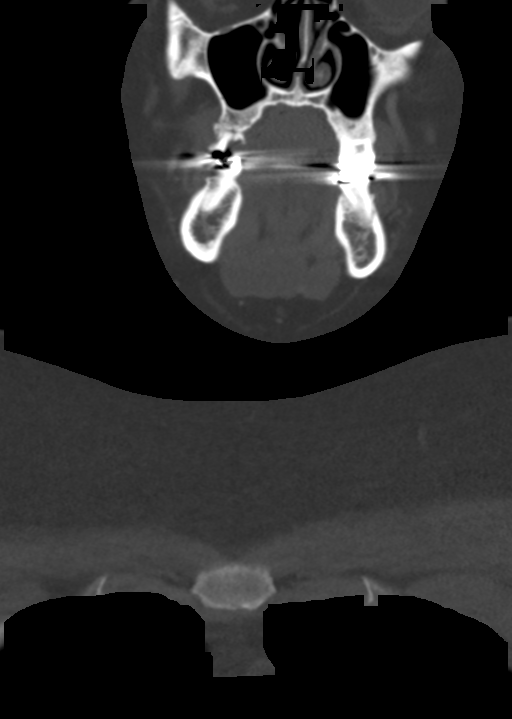
[im 41/101  bone]
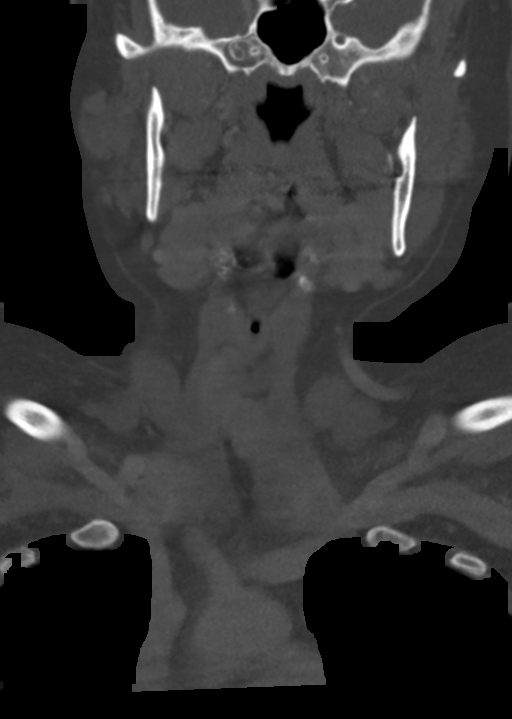
[im 61/101  bone]
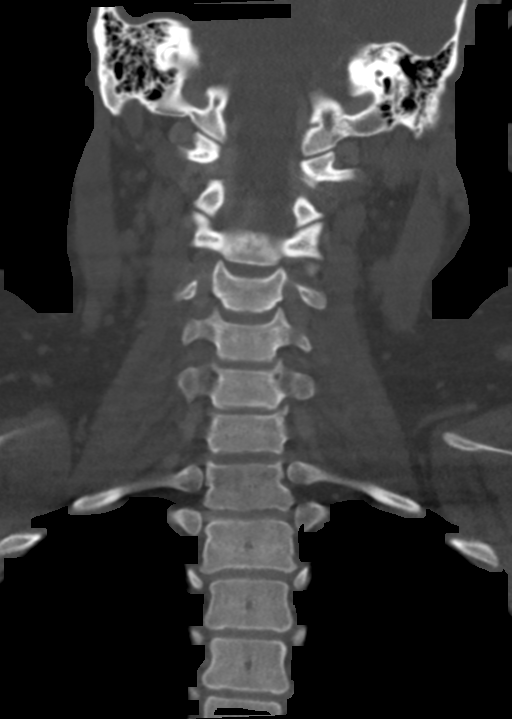

[Series 8: orthogonal (person_name) · axial · 0.39mm/px · z∈[+1511,+1585]mm · 2 of 116 slices shown]
[im 39/116  bone]
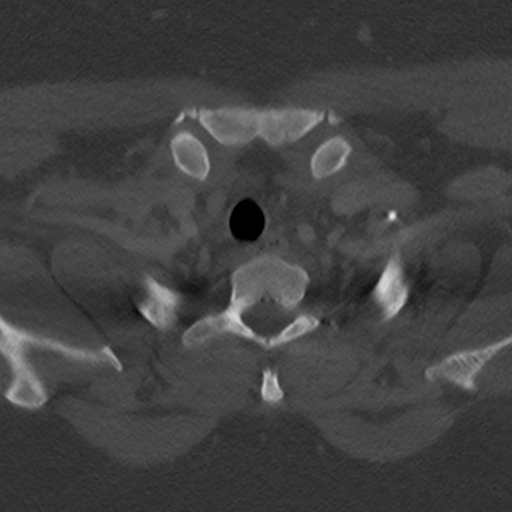
[im 77/116  bone]
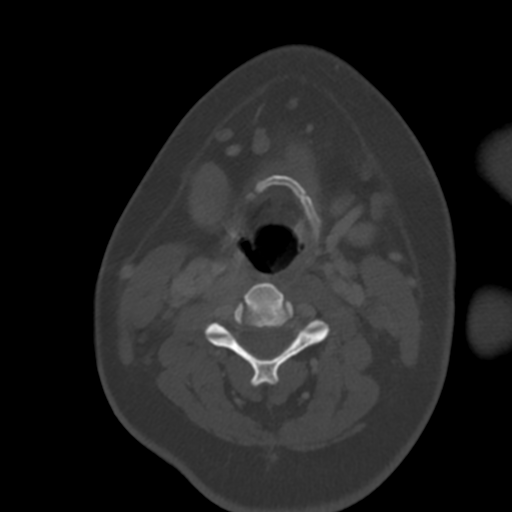

[Series 9: sag neck · sagittal · 0.40mm/px · 5 of 101 slices shown, 6 images]
[im 34/101  bone]
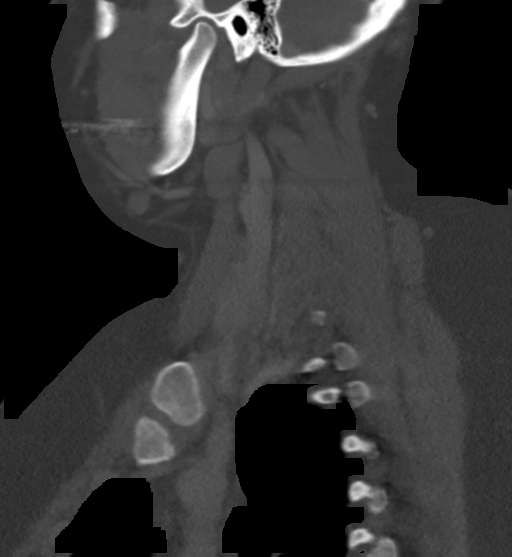
[im 42/101  bone]
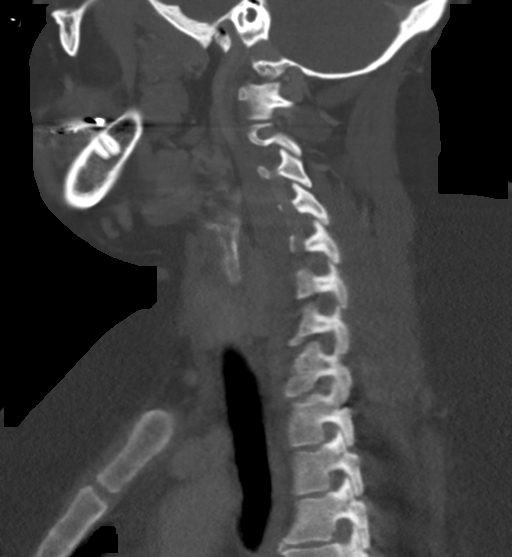
[im 51/101  soft-tissue]
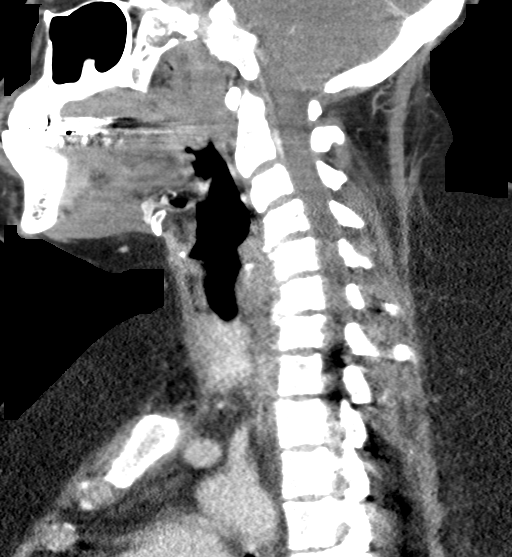
[im 51/101  bone]
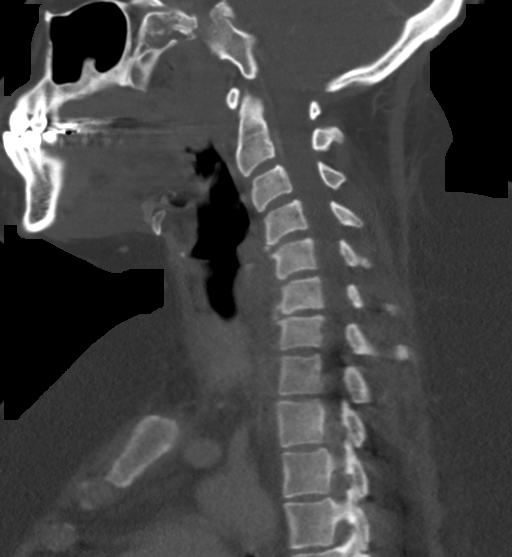
[im 59/101  bone]
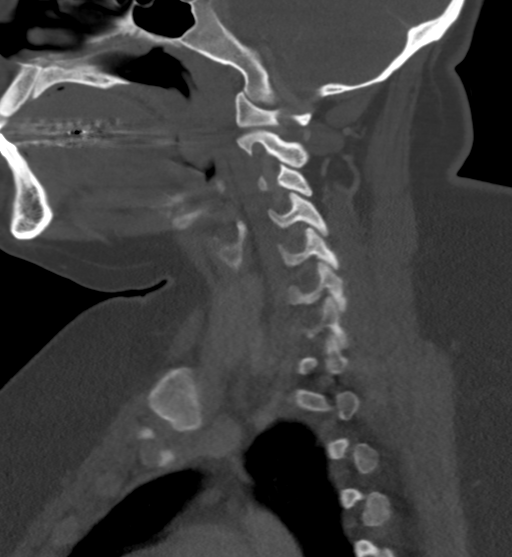
[im 67/101  bone]
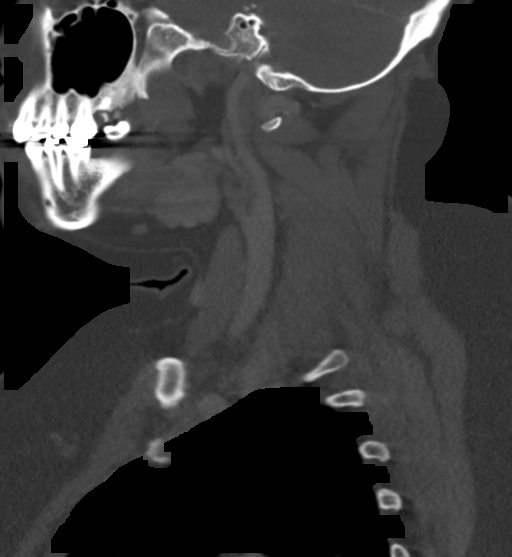

[12 of 33 positions shown; findings below may reference images not displayed]

FINDINGS: Pharynx and larynx: Normal. No mass or swelling. Mildly prominent
tonsils bilaterally. Airway intact.

Salivary glands: No inflammation, mass, or stone.

Thyroid: Negative

Lymph nodes: Mildly prominent level 2 lymph nodes bilaterally,
measuring 13 mm bilaterally. No nodal necrosis. These are not
pathologically enlarged. No other enlarged lymph nodes identified in
the neck.

Vascular: Normal vascular enhancement.

Limited intracranial: Negative

Visualized orbits: Not included

Mastoids and visualized paranasal sinuses: Negative

Skeleton: No acute skeletal abnormality.  Multiple dental caries.

Upper chest: Lung apices clear bilaterally.

Other: Palpable abnormality marked with a BB. This is in the right
supraclavicular area and corresponds to fatty subcutaneous tissues.
This appears relatively well encapsulated on the sagittal images and
appears to represent a lipoma measuring approximately 46 x 18 mm.
See sagittal image 10. This appears to correspond to the ultrasound
abnormality which also is subcutaneous and has homogeneous increased
echogenicity compatible with fatty tissue. No soft tissue mass in
the area.
IMPRESSION: Palpable abnormality in the right supraclavicular fossa corresponds
to a subcutaneous lipoma measuring 46 x 48 mm

Negative for mass or pathologic adenopathy in the neck.
